# Patient Record
Sex: Female | Born: 1960 | Race: White | Hispanic: No | Marital: Single | Smoking: Current every day smoker
Health system: Southern US, Community
[De-identification: ages and names within clinical notes are randomized; demographics above are authoritative.]

## PROBLEM LIST (undated history)

## (undated) DIAGNOSIS — E079 Disorder of thyroid, unspecified: Secondary | ICD-10-CM

## (undated) HISTORY — PX: AUGMENTATION MAMMAPLASTY: SUR837

---

## 1998-01-06 ENCOUNTER — Other Ambulatory Visit: Admission: RE | Admit: 1998-01-06 | Discharge: 1998-01-06 | Payer: Self-pay | Admitting: Obstetrics and Gynecology

## 1999-03-16 ENCOUNTER — Other Ambulatory Visit: Admission: RE | Admit: 1999-03-16 | Discharge: 1999-03-16 | Payer: Self-pay | Admitting: Obstetrics and Gynecology

## 2000-03-28 ENCOUNTER — Other Ambulatory Visit: Admission: RE | Admit: 2000-03-28 | Discharge: 2000-03-28 | Payer: Self-pay | Admitting: Obstetrics and Gynecology

## 2001-05-29 ENCOUNTER — Other Ambulatory Visit: Admission: RE | Admit: 2001-05-29 | Discharge: 2001-05-29 | Payer: Self-pay | Admitting: Obstetrics and Gynecology

## 2002-06-25 ENCOUNTER — Other Ambulatory Visit: Admission: RE | Admit: 2002-06-25 | Discharge: 2002-06-25 | Payer: Self-pay | Admitting: Obstetrics and Gynecology

## 2003-12-12 ENCOUNTER — Other Ambulatory Visit: Admission: RE | Admit: 2003-12-12 | Discharge: 2003-12-12 | Payer: Self-pay | Admitting: Obstetrics and Gynecology

## 2004-09-08 ENCOUNTER — Encounter: Admission: RE | Admit: 2004-09-08 | Discharge: 2004-09-08 | Payer: Self-pay | Admitting: Neurological Surgery

## 2005-03-04 ENCOUNTER — Other Ambulatory Visit: Admission: RE | Admit: 2005-03-04 | Discharge: 2005-03-04 | Payer: Self-pay | Admitting: Obstetrics and Gynecology

## 2005-04-21 ENCOUNTER — Encounter: Payer: Self-pay | Admitting: Internal Medicine

## 2006-05-24 ENCOUNTER — Encounter: Payer: Self-pay | Admitting: Internal Medicine

## 2006-11-09 ENCOUNTER — Encounter: Payer: Self-pay | Admitting: Internal Medicine

## 2006-11-09 ENCOUNTER — Encounter: Admission: RE | Admit: 2006-11-09 | Discharge: 2006-11-09 | Payer: Self-pay | Admitting: Emergency Medicine

## 2008-03-13 ENCOUNTER — Ambulatory Visit: Payer: Self-pay | Admitting: Internal Medicine

## 2008-03-13 DIAGNOSIS — E039 Hypothyroidism, unspecified: Secondary | ICD-10-CM | POA: Insufficient documentation

## 2008-03-13 DIAGNOSIS — N926 Irregular menstruation, unspecified: Secondary | ICD-10-CM | POA: Insufficient documentation

## 2008-03-13 LAB — CONVERTED CEMR LAB
Beta hcg, urine, semiquantitative: NEGATIVE
Bilirubin Urine: NEGATIVE
Ketones, urine, test strip: NEGATIVE
Specific Gravity, Urine: 1.01
WBC Urine, dipstick: NEGATIVE
pH: 7

## 2008-03-25 ENCOUNTER — Encounter (INDEPENDENT_AMBULATORY_CARE_PROVIDER_SITE_OTHER): Payer: Self-pay | Admitting: *Deleted

## 2008-03-25 ENCOUNTER — Ambulatory Visit: Payer: Self-pay | Admitting: Internal Medicine

## 2008-03-25 LAB — CONVERTED CEMR LAB
ALT: 13 units/L (ref 0–35)
AST: 20 units/L (ref 0–37)
Eosinophils Absolute: 0.2 10*3/uL (ref 0.0–0.7)
HCT: 34.2 % — ABNORMAL LOW (ref 36.0–46.0)
Hemoglobin: 11.7 g/dL — ABNORMAL LOW (ref 12.0–15.0)
Lymphocytes Relative: 30.2 % (ref 12.0–46.0)
MCHC: 34.1 g/dL (ref 30.0–36.0)
MCV: 89.7 fL (ref 78.0–100.0)
Neutro Abs: 4.5 10*3/uL (ref 1.4–7.7)
Neutrophils Relative %: 62.4 % (ref 43.0–77.0)
OCCULT 1: NEGATIVE
Platelets: 273 10*3/uL (ref 150–400)
TSH: 2.12 microintl units/mL (ref 0.35–5.50)
Total Bilirubin: 0.5 mg/dL (ref 0.3–1.2)
Total Protein: 6.4 g/dL (ref 6.0–8.3)
WBC: 7.3 10*3/uL (ref 4.5–10.5)

## 2008-03-26 ENCOUNTER — Encounter (INDEPENDENT_AMBULATORY_CARE_PROVIDER_SITE_OTHER): Payer: Self-pay | Admitting: *Deleted

## 2008-03-28 ENCOUNTER — Encounter: Admission: RE | Admit: 2008-03-28 | Discharge: 2008-03-28 | Payer: Self-pay | Admitting: Internal Medicine

## 2008-03-29 ENCOUNTER — Encounter (INDEPENDENT_AMBULATORY_CARE_PROVIDER_SITE_OTHER): Payer: Self-pay | Admitting: *Deleted

## 2008-05-13 ENCOUNTER — Ambulatory Visit: Payer: Self-pay | Admitting: Internal Medicine

## 2008-08-08 ENCOUNTER — Ambulatory Visit: Payer: Self-pay | Admitting: Internal Medicine

## 2008-12-20 ENCOUNTER — Ambulatory Visit: Payer: Self-pay | Admitting: Internal Medicine

## 2008-12-20 DIAGNOSIS — R51 Headache: Secondary | ICD-10-CM

## 2008-12-20 DIAGNOSIS — G479 Sleep disorder, unspecified: Secondary | ICD-10-CM | POA: Insufficient documentation

## 2008-12-20 DIAGNOSIS — R519 Headache, unspecified: Secondary | ICD-10-CM | POA: Insufficient documentation

## 2009-08-21 ENCOUNTER — Ambulatory Visit: Payer: Self-pay | Admitting: Family Medicine

## 2009-09-03 ENCOUNTER — Telehealth (INDEPENDENT_AMBULATORY_CARE_PROVIDER_SITE_OTHER): Payer: Self-pay | Admitting: *Deleted

## 2010-01-09 ENCOUNTER — Telehealth (INDEPENDENT_AMBULATORY_CARE_PROVIDER_SITE_OTHER): Payer: Self-pay | Admitting: *Deleted

## 2010-03-09 ENCOUNTER — Ambulatory Visit: Payer: Self-pay | Admitting: Internal Medicine

## 2010-03-09 DIAGNOSIS — J019 Acute sinusitis, unspecified: Secondary | ICD-10-CM

## 2010-04-02 ENCOUNTER — Telehealth: Payer: Self-pay | Admitting: Internal Medicine

## 2010-04-02 ENCOUNTER — Ambulatory Visit: Payer: Self-pay | Admitting: Internal Medicine

## 2010-04-02 LAB — CONVERTED CEMR LAB: Rapid Strep: NEGATIVE

## 2010-05-28 NOTE — Progress Notes (Signed)
Summary: refill  Phone Note Refill Request Message from:  Fax from Pharmacy on Sep 03, 2009 11:11 AM  Refills Requested: Medication #1:  zolpidem tartrate 10mg  tab 1 tab at bedtime as needed fax from [facility] [address] fax # [phone]   Method Requested: Fax to Local Pharmacy Initial call taken by: Okey Regal Spring,  Sep 03, 2009 11:13 AM    Prescriptions: AMBIEN 10 MG TABS (ZOLPIDEM TARTRATE) 1 by mouth as needed  #10 x 1   Entered by:   Shonna Chock   Authorized by:   Marga Melnick MD   Signed by:   Shonna Chock on 09/03/2009   Method used:   Printed then faxed to ...       [facility] [address] (retail)       [address]       [place], [place]  [address]       Ph: [phone] or [phone]       Fax: [phone]   RxID:   [id number]

## 2010-05-28 NOTE — Assessment & Plan Note (Signed)
Summary: bad cough//lch   Vital Signs:  Patient profile:   50 year old female Height:      67 inches Weight:      136.13 pounds Temp:     97.6 degrees F oral Pulse rate:   56 / minute CC: c/o cough, head and chest congestion x 1 week   History of Present Illness: 50 yo woman here today for cough and congestion.  started 1 week ago.  no fever.  cough is productive of clear sputum.  + fatigue.  no facial pain or pressure, sore throat, ear pain.  using Mucinex w/out relief.  cough not keeping pt awake.  no known sick contacts.  Current Medications (verified): 1)  Ambien 10 Mg Tabs (Zolpidem Tartrate) .Marland Kitchen.. 1 By Mouth As Needed 2)  Alprazolam 0.5 Mg Tabs (Alprazolam) .... 1/2 By Mouth Once Daily As Needed 3)  Midrin 325-65-100 Mg Caps (Apap-Isometheptene-Dichloral) .... 2 Capsules @ The Onset of Headache Then 1 Every 5 Hour As Needed . No More Than 5 Tab Daily  Allergies (verified): No Known Drug Allergies  Review of Systems      See HPI  Physical Exam  General:  Thin,well-nourished,in no acute distress; alert,appropriate and cooperative throughout examination Head:  NCAT, no TTP over sinuses Eyes:  no injxn or inflammation Ears:  External ear exam shows no significant lesions or deformities.  Otoscopic examination reveals clear canals, tympanic membranes are intact bilaterally without bulging, retraction, inflammation or discharge. Hearing is grossly normal bilaterally. Nose:  + congestion Mouth:  + PND Lungs:  Normal respiratory effort, chest expands symmetrically. Lungs are clear to auscultation, no crackles or wheezes.  + barking cough Heart:  Normal rate and regular rhythm. S1 and S2 normal Cervical Nodes:  No lymphadenopathy noted   Impression & Recommendations:  Problem # 1:  BRONCHITIS- ACUTE (ICD-466.0) Assessment New pt's duration of sxs warrant abx.  no red flags on hx or PE- no suspicion for PNA (lack of fever, lungs clear).  Tessalon for cough.  reviewed supportive  care and red flags that should prompt return.  Pt expresses understanding and is in agreement w/ this plan. Her updated medication list for this problem includes:    Azithromycin 250 Mg Tabs (Azithromycin) .Marland Kitchen... 2 by  mouth today and then 1 daily for 4 days    Tessalon 200 Mg Caps (Benzonatate) .Marland Kitchen... 1 q8 as needed for cough  Complete Medication List: 1)  Ambien 10 Mg Tabs (Zolpidem tartrate) .Marland Kitchen.. 1 by mouth as needed 2)  Alprazolam 0.5 Mg Tabs (Alprazolam) .... 1/2 by mouth once daily as needed 3)  Midrin 325-65-100 Mg Caps (Apap-isometheptene-dichloral) .... 2 capsules @ the onset of headache then 1 every 5 hour as needed . no more than 5 tab daily 4)  Azithromycin 250 Mg Tabs (Azithromycin) .... 2 by  mouth today and then 1 daily for 4 days 5)  Tessalon 200 Mg Caps (Benzonatate) .Marland Kitchen.. 1 q8 as needed for cough  Patient Instructions: 1)  Take the Azithromycin as directed 2)  Use the cough pills as needed 3)  Continue the Mucinex 4)  Tylenol/Ibuprofen as needed 5)  If no improvement by mid week- please call 6)  Hang in there! Prescriptions: TESSALON 200 MG CAPS (BENZONATATE) 1 Q8 as needed for cough  #45 x 0   Entered and Authorized by:   Neena Rhymes MD   Signed by:   Neena Rhymes MD on 08/21/2009   Method used:   Electronically to  [id number] (retail)       [address]       [place], [place]  [address]       Ph: [phone] or [phone]       Fax: [phone]   RxID:   [id number] AZITHROMYCIN 250 MG  TABS (AZITHROMYCIN) 2 by  mouth today and then 1 daily for 4 days  #6 x 0   Entered and Authorized by:   Neena Rhymes MD   Signed by:   Neena Rhymes MD on 08/21/2009   Method used:   Electronically to        [facility] [phone]* (retail)       [address]       [place], [place]  [address]       Ph: [phone] or [phone]       Fax: [phone]   RxID:   [id number]

## 2010-05-28 NOTE — Progress Notes (Signed)
Summary: alternative  Phone Note Refill Request Message from:  Pharmacy on April 02, 2010 9:53 AM  Refills Requested: Medication #1:  CLARITHROMYCIN 500 MG XR24H-TAB 2 pills once daily with food Med above is on back order and patient is waiting at pharmacy for alternative. Please advise.  Initial call taken by: Lucious Groves CMA,  April 02, 2010 9:53 AM  Follow-up for Phone Call        Per MD they can give:   Pen V K 500mg  1 by mouth qid #40 (generic ok)  Pharmacy notified. Follow-up by: Lucious Groves CMA,  April 02, 2010 10:00 AM    New/Updated Medications: PENICILLIN V POTASSIUM 500 MG TABS (PENICILLIN V POTASSIUM) 1 by mouth qid Prescriptions: PENICILLIN V POTASSIUM 500 MG TABS (PENICILLIN V POTASSIUM) 1 by mouth qid  #40 x 0   Entered by:   Lucious Groves CMA   Authorized by:   Marga Melnick MD   Signed by:   Lucious Groves CMA on 04/02/2010   Method used:   Telephoned to ...       [facility] [address] (retail)       [address]       [place], [place]  [address]       Ph: [phone] or [phone]       Fax: [phone]   RxID:   [id number]

## 2010-05-28 NOTE — Assessment & Plan Note (Signed)
Summary: sore throat/cbs   Vital Signs:  Patient profile:   50 year old female Weight:      131.6 pounds BMI:     20.69 Temp:     98.7 degrees F oral Pulse rate:   72 / minute Resp:     14 per minute BP sitting:   110 / 68  (left arm) Cuff size:   regular  Vitals Entered By: Shonna Chock CMA (April 02, 2010 9:14 AM) CC: Sore throat since yesterday , URI symptoms   CC:  Sore throat since yesterday  and URI symptoms.  History of Present Illness:      This is a 50 year old woman who presents with URI symptoms; onset 12/7 as severe ST.  The patient reports sore throat &  R  earache, but denies nasal congestion, purulent nasal discharge, and productive cough.  The patient denies fever, dyspnea, and wheezing.  The patient denies itchy watery eyes, sneezing, and headache.  Risk factors for Strep sinusitis include tender adenopathy.  The patient denies the following risk factors for Strep sinusitis: unilateral facial pain and tooth pain.  Rx: none    Current Medications (verified): 1)  Ambien 10 Mg Tabs (Zolpidem Tartrate) .Marland Kitchen.. 1 By Mouth As Needed 2)  Alprazolam 0.5 Mg Tabs (Alprazolam) .... 1/2 By Mouth Once Daily As Needed  Allergies (verified): No Known Drug Allergies  Physical Exam  General:  Appears uncomfortable but in no acute distress; alert,appropriate and cooperative throughout examination Ears:  External ear exam shows no significant lesions or deformities.  Otoscopic examination reveals clear canals, tympanic membranes are intact bilaterally without bulging, retraction, inflammation or discharge. Hearing is grossly normal bilaterally. Nose:  External nasal examination shows no deformity or inflammation. Nasal mucosa are pink and moist without lesions or exudates. Mouth:   R tonsil hypertropied with shaggy  pharyngeal exudate.   Lungs:  Normal respiratory effort, chest expands symmetrically. Lungs are clear to auscultation, no crackles or wheezes. Heart:  Normal rate and  regular rhythm. S1 and S2 normal without gallop, murmur, click, rub or other extra sounds. Abdomen:  Bowel sounds positive,abdomen soft and non-tender without masses, organomegaly or hernias noted. Cervical Nodes:  R anterior LN tender.   Axillary Nodes:  No palpable lymphadenopathy   Impression & Recommendations:  Problem # 1:  PHARYNGITIS-ACUTE (ICD-462)  The following medications were removed from the medication list:    Amoxicillin-pot Clavulanate 875-125 Mg Tabs (Amoxicillin-pot clavulanate) .Marland Kitchen... 1 every 12 hrs with a meal Her updated medication list for this problem includes:    Clarithromycin 500 Mg Xr24h-tab (Clarithromycin) .Marland Kitchen... 2 pills once daily with food  Complete Medication List: 1)  Ambien 10 Mg Tabs (Zolpidem tartrate) .Marland Kitchen.. 1 by mouth as needed 2)  Alprazolam 0.5 Mg Tabs (Alprazolam) .... 1/2 by mouth once daily as needed 3)  Clarithromycin 500 Mg Xr24h-tab (Clarithromycin) .... 2 pills once daily with food 4)  Prednisone 20 Mg Tabs (Prednisone) .Marland Kitchen.. 1 two times a day with food  Other Orders: Rapid Strep ([address])  Patient Instructions: 1)  Zicam Melts as needed for sore throat. 2)  Please schedule a colonoscopy as we discussed based on SOC. Prescriptions: PREDNISONE 20 MG TABS (PREDNISONE) 1 two times a day with food  #10 x 0   Entered and Authorized by:   Marga Melnick MD   Signed by:   Marga Melnick MD on 04/02/2010   Method used:   F[facility]ed to ...       [facility] [phone][facility]       [address]       [place], [place]  [address]       Ph: [phone] or [phone]       Fax: [phone]   RxID:   [id number] CLARITHROMYCIN 500 MG XR24H-TAB (CLARITHROMYCIN) 2 pills once daily with food  #20 x 0   Entered and Authorized by:   Marga Melnick MD   Signed by:   Marga Melnick MD on 04/02/2010   Method used:   Faxed to ...       [id number] ([facility]       [address]       [place], [place]  [address]       Ph: [phone] or [phone]        Fax: [phone]   RxID:   [id number]    Orders Added: 1)  Rapid Strep [[address]] 2)  Est. Patient Level III [[address]]    Laboratory Results    Other Tests  Rapid Strep: negative

## 2010-05-28 NOTE — Progress Notes (Signed)
Summary: refill  Phone Note Refill Request Message from:  Fax from Pharmacy on January 09, 2010 8:53 AM  Refills Requested: Medication #1:  ALPRAZOLAM 0.5 MG TABS 1/2 by mouth once daily as needed [facility] - fax [phone]  Initial call taken by: Okey Regal Spring,  January 09, 2010 8:53 AM    New/Updated Medications: ALPRAZOLAM 0.5 MG TABS (ALPRAZOLAM) 1/2 by mouth once daily as needed **APPOINTMENT DUE** Prescriptions: ALPRAZOLAM 0.5 MG TABS (ALPRAZOLAM) 1/2 by mouth once daily as needed **APPOINTMENT DUE**  #30 x 0   Entered by:   Shonna Chock CMA   Authorized by:   Marga Melnick MD   Signed by:   Shonna Chock CMA on 01/09/2010   Method used:   Printed then faxed to ...       [facility] [address] (retail)       [address]       [place], [place]  [address]       Ph: [phone] or [phone]       Fax: [phone]   RxID:   [id number]

## 2010-05-28 NOTE — Assessment & Plan Note (Signed)
Summary: cough/cbs   Vital Signs:  Patient profile:   50 year old female Weight:      132.2 pounds BMI:     20.78 Temp:     98.2 degrees F oral Pulse rate:   72 / minute Resp:     16 per minute BP sitting:   100 / 64  (left arm) Cuff size:   regular  Vitals Entered By: Shonna Chock CMA (March 09, 2010 11:59 AM) CC: ? Sinus Infection, URI symptoms   CC:  ? Sinus Infection and URI symptoms.  History of Present Illness:  URI Symptoms      This is a 50 year old woman who presents with URI symptoms; onset 11/11 as ST & congestion.  The patient reports nasal congestion, purulent nasal discharge, sore throat, and productive cough, but denies earache.  Associated symptoms include low-grade fever (<100.5 degrees).  The patient denies dyspnea and wheezing.  The patient denies headache.  The patient denies the following risk factors for Strep sinusitis: unilateral facial pain, tooth pain, and tender adenopathy.  Rx: NSAIDS  Current Medications (verified): 1)  Ambien 10 Mg Tabs (Zolpidem Tartrate) .Marland Kitchen.. 1 By Mouth As Needed 2)  Alprazolam 0.5 Mg Tabs (Alprazolam) .... 1/2 By Mouth Once Daily As Needed **appointment Due**  Allergies (verified): No Known Drug Allergies  Physical Exam  General:  Thin,well-nourished,in no acute distress; alert,appropriate and cooperative throughout examination Eyes:  No corneal or conjunctival inflammation noted.  Ears:  External ear exam shows no significant lesions or deformities.  Otoscopic examination reveals clear canals, tympanic membranes are intact bilaterally without bulging, retraction, inflammation or discharge. Hearing is grossly normal bilaterally. Nose:  External nasal examination shows no deformity or inflammation. Nasal mucosa are pink and moist without lesions or exudates. Mouth:  Oral mucosa and oropharynx without lesions or exudates.  Teeth in good repair. Lungs:  Normal respiratory effort, chest expands symmetrically. Lungs : minimal  crackles Heart:  Normal rate and regular rhythm. S1 and S2 normal without gallop, murmur, click, rub or other extra sounds. Cervical Nodes:  Shotty LA Axillary Nodes:  No palpable lymphadenopathy   Impression & Recommendations:  Problem # 1:  SINUSITIS- ACUTE-NOS (ICD-461.9)  The following medications were removed from the medication list:    Tessalon 200 Mg Caps (Benzonatate) .Marland Kitchen... 1 q8 as needed for cough Her updated medication list for this problem includes:    Amoxicillin-pot Clavulanate 875-125 Mg Tabs (Amoxicillin-pot clavulanate) .Marland Kitchen... 1 every 12 hrs with a meal  Problem # 2:  BRONCHITIS-ACUTE (ICD-466.0)  The following medications were removed from the medication list:    Tessalon 200 Mg Caps (Benzonatate) .Marland Kitchen... 1 q8 as needed for cough Her updated medication list for this problem includes:    Amoxicillin-pot Clavulanate 875-125 Mg Tabs (Amoxicillin-pot clavulanate) .Marland Kitchen... 1 every 12 hrs with a meal  Complete Medication List: 1)  Ambien 10 Mg Tabs (Zolpidem tartrate) .Marland Kitchen.. 1 by mouth as needed 2)  Alprazolam 0.5 Mg Tabs (Alprazolam) .... 1/2 by mouth once daily as needed 3)  Amoxicillin-pot Clavulanate 875-125 Mg Tabs (Amoxicillin-pot clavulanate) .Marland Kitchen.. 1 every 12 hrs with a meal  Patient Instructions: 1)  Drink as much  NON dairy fluid as you can tolerate for the next few days. Netipot once daily until  sinuses are clear. Prescriptions: ALPRAZOLAM 0.5 MG TABS (ALPRAZOLAM) 1/2 by mouth once daily as needed  #30 x 1   Entered and Authorized by:   Marga Melnick MD   Signed by:   Chrissie Noa  Hopper MD on 03/09/2010   Method used:   Print then Give to Patient   RxID:   [id number] AMOXICILLIN-POT CLAVULANATE 875-125 MG TABS (AMOXICILLIN-POT CLAVULANATE) 1 every 12 hrs with a meal  #20 x 0   Entered and Authorized by:   Marga Melnick MD   Signed by:   Marga Melnick MD on 03/09/2010   Method used:   Faxed to ...       [id number]       [address]       [place], [place]  [address]       Ph: [phone] or [phone]       Fax: [phone]   RxID:   [id number]    Orders Added: 1)  Est. Patient Level III [[address]]

## 2010-08-28 ENCOUNTER — Other Ambulatory Visit: Payer: Self-pay

## 2010-08-28 MED ORDER — ALPRAZOLAM 0.5 MG PO TABS: 0.5000 mg | ORAL_TABLET | ORAL | Status: DC

## 2010-12-07 ENCOUNTER — Other Ambulatory Visit: Payer: Self-pay | Admitting: Internal Medicine

## 2010-12-08 MED ORDER — ALPRAZOLAM 0.5 MG PO TABS: 0.5000 mg | ORAL_TABLET | ORAL | Status: DC

## 2010-12-08 NOTE — Telephone Encounter (Signed)
rx sent to pharmacy

## 2011-03-12 ENCOUNTER — Encounter: Payer: Self-pay | Admitting: Family Medicine

## 2011-03-12 ENCOUNTER — Ambulatory Visit (INDEPENDENT_AMBULATORY_CARE_PROVIDER_SITE_OTHER): Payer: PRIVATE HEALTH INSURANCE | Admitting: Family Medicine

## 2011-03-12 DIAGNOSIS — R05 Cough: Secondary | ICD-10-CM

## 2011-03-12 DIAGNOSIS — J111 Influenza due to unidentified influenza virus with other respiratory manifestations: Secondary | ICD-10-CM

## 2011-03-12 DIAGNOSIS — J3489 Other specified disorders of nose and nasal sinuses: Secondary | ICD-10-CM

## 2011-03-12 DIAGNOSIS — R6889 Other general symptoms and signs: Secondary | ICD-10-CM

## 2011-03-12 DIAGNOSIS — J40 Bronchitis, not specified as acute or chronic: Secondary | ICD-10-CM | POA: Insufficient documentation

## 2011-03-12 MED ORDER — BENZONATATE 200 MG PO CAPS: 200.0000 mg | ORAL_CAPSULE | Freq: Three times a day (TID) | ORAL | Status: AC | PRN

## 2011-03-12 MED ORDER — GUAIFENESIN-CODEINE 100-10 MG/5ML PO SYRP: 5.0000 mL | ORAL_SOLUTION | Freq: Two times a day (BID) | ORAL | Status: DC | PRN

## 2011-03-12 MED ORDER — AZITHROMYCIN 250 MG PO TABS: 250.0000 mg | ORAL_TABLET | Freq: Every day | ORAL | Status: AC

## 2011-03-12 MED ORDER — ALPRAZOLAM 0.5 MG PO TABS: 0.5000 mg | ORAL_TABLET | ORAL | Status: DC

## 2011-03-12 NOTE — Patient Instructions (Signed)
This is most consistent w/ a bronchitis/upper respiratory infection Take the Azithromycin as directed Use the cough meds as needed Drink plenty of fluids REST! Hang in there!

## 2011-03-12 NOTE — Assessment & Plan Note (Signed)
Pt's sxs and PE consistent w/ bronchitis.  Flu test (-).  Start abx.  Reviewed supportive care and red flags that should prompt return.  Pt expressed understanding and is in agreement w/ plan.

## 2011-03-12 NOTE — Progress Notes (Signed)
  Subjective:    Patient ID: Amber Krueger, female    DOB: 08-24-1960, 50 y.o.   MRN: [id number]  HPI ? Flu- pt reports she had 'tight chest' yesterday, cough developed, Tm 101 last night.  Got the flu shot 2 weeks ago.  L sided ear pressure, + sinus pressure, + sore throat.  No known sick contacts.  Denies body aches.  + HA, dizziness.  Review of Systems For ROS see HPI     Objective:   Physical Exam  Vitals reviewed. Constitutional: She appears well-developed and well-nourished. No distress.  HENT:  Head: Normocephalic and atraumatic.       TMs normal bilaterally Mild nasal congestion Throat w/out erythema, edema, or exudate  Eyes: Conjunctivae and EOM are normal. Pupils are equal, round, and reactive to light.  Neck: Normal range of motion. Neck supple.  Cardiovascular: Normal rate, regular rhythm, normal heart sounds and intact distal pulses.   No murmur heard. Pulmonary/Chest: Effort normal and breath sounds normal. No respiratory distress. She has no wheezes.       + hacking cough  Lymphadenopathy:    She has no cervical adenopathy.          Assessment & Plan:

## 2011-09-02 ENCOUNTER — Telehealth: Payer: Self-pay | Admitting: Internal Medicine

## 2011-09-02 NOTE — Telephone Encounter (Signed)
03-12-11 Last OV, Last filled #30

## 2011-09-02 NOTE — Telephone Encounter (Signed)
Refill: Alprazolam 0.5mg  tablet. Take one tablet by mouth as directed. 1/2 by mouth daily as needed.

## 2011-09-02 NOTE — Telephone Encounter (Signed)
OK X1 

## 2011-09-03 MED ORDER — ALPRAZOLAM 0.5 MG PO TABS: 0.5000 mg | ORAL_TABLET | ORAL | Status: DC

## 2011-09-03 NOTE — Telephone Encounter (Signed)
Rx sent 

## 2011-12-03 ENCOUNTER — Telehealth: Payer: Self-pay | Admitting: Internal Medicine

## 2011-12-03 MED ORDER — ALPRAZOLAM 0.5 MG PO TABS: 0.5000 mg | ORAL_TABLET | ORAL | Status: DC

## 2011-12-03 NOTE — Telephone Encounter (Signed)
Refill: Alprazolam 0.5mg  tablet. Take one tablet by mouth as directed. 1/2 by mouth daily as needed. Last fill 09-03-11

## 2011-12-03 NOTE — Telephone Encounter (Signed)
CPX due  

## 2012-01-17 ENCOUNTER — Ambulatory Visit (INDEPENDENT_AMBULATORY_CARE_PROVIDER_SITE_OTHER): Payer: Commercial Managed Care - PPO | Admitting: Internal Medicine

## 2012-01-17 ENCOUNTER — Encounter: Payer: Self-pay | Admitting: Internal Medicine

## 2012-01-17 VITALS — BP 114/70 | HR 66 | Temp 98.4°F | Wt 129.0 lb

## 2012-01-17 DIAGNOSIS — M79609 Pain in unspecified limb: Secondary | ICD-10-CM

## 2012-01-17 DIAGNOSIS — M79632 Pain in left forearm: Secondary | ICD-10-CM

## 2012-01-17 MED ORDER — CELECOXIB 200 MG PO CAPS: 200.0000 mg | ORAL_CAPSULE | Freq: Two times a day (BID) | ORAL | Status: DC

## 2012-01-17 NOTE — Progress Notes (Signed)
  Subjective:    Patient ID: Amber Krueger, female    DOB: 1960-08-08, 51 y.o.   MRN: [id number]  HPI Symptoms began approximately one week ago as burning intermittently in the left proximal, mid forearm below the antecubital area. There was no injury or trigger for this. There's minimal localized radiation in the immediate area. She describes it is superficial; Advil once daily did not help. It is exacerbated if she is carrying weight in her palm with the arm extended period.  The symptoms seem to be stable to progressive.  There is no personal or family history of deep venous thrombosis or clotting disorder    Review of Systems Constitutional: no fever, chills, sweats, change in weight  Musculoskeletal:no  muscle cramps or pain; no  joint stiffness, redness, or swelling Skin:no rash, color change Neuro: no weakness; incontinence (stool/urine); numbness and tingling Heme:no lymphadenopathy; abnormal bruising or bleeding        Objective:   Physical Exam  She is thin but healthy in appearance; she is in no acute distress  There is no lymphadenopathy about the neck, axilla, or epitrochlear areas.  Tone and strength are normal.  Deep tendon reflexes are normal.  With opposition there is tenderness of the proximal ulnar surface of the forearm. There is a vein that's palpable and visible with the opposition maneuvers.  Range of motion is excellent; she has some crepitus at the shoulders        Assessment & Plan:  #1 form discomfort; this appears to be venous in etiology. I cannot palpate a phlebolith.  Plan: See orders and recommendations

## 2012-01-17 NOTE — Patient Instructions (Addendum)
Use an anti-inflammatory cream such as Aspercreme or Zostrix cream twice a day to the lforearm. In lieu of this warm moist compresses or  hot water bottle can be used. Do not apply ice .   If you activate My Chart; the results can be released to you as soon as they populate from the lab. If you choose not to use this program; the labs have to be reviewed, copied & mailed   causing a delay in getting the results to you.

## 2012-02-03 ENCOUNTER — Encounter: Payer: Self-pay | Admitting: Internal Medicine

## 2012-04-11 ENCOUNTER — Other Ambulatory Visit: Payer: Self-pay | Admitting: Internal Medicine

## 2012-04-11 NOTE — Telephone Encounter (Signed)
Dr Alwyn Ren has been filling med- he is PCP

## 2012-04-11 NOTE — Telephone Encounter (Signed)
Alprazolam .5mg  Qty: Last fill: 8.9.13 Take 1/2 tablet by mouth as needed as directed

## 2012-04-11 NOTE — Telephone Encounter (Signed)
Please advise on RF request.//AB/CMA 

## 2012-04-12 MED ORDER — ALPRAZOLAM 0.5 MG PO TABS: 0.5000 mg | ORAL_TABLET | ORAL | Status: DC

## 2012-04-12 NOTE — Telephone Encounter (Signed)
RX called in for # 30

## 2012-06-10 ENCOUNTER — Other Ambulatory Visit: Payer: Self-pay

## 2012-08-02 ENCOUNTER — Encounter (HOSPITAL_COMMUNITY): Payer: Self-pay | Admitting: Cardiology

## 2012-08-02 ENCOUNTER — Emergency Department (HOSPITAL_COMMUNITY)
Admission: EM | Admit: 2012-08-02 | Discharge: 2012-08-02 | Disposition: A | Discharge status: Home / Self Care | Payer: Commercial Managed Care - PPO | Attending: Emergency Medicine | Admitting: Emergency Medicine

## 2012-08-02 ENCOUNTER — Emergency Department (HOSPITAL_COMMUNITY): Payer: Commercial Managed Care - PPO

## 2012-08-02 DIAGNOSIS — S0990XA Unspecified injury of head, initial encounter: Secondary | ICD-10-CM

## 2012-08-02 DIAGNOSIS — Z79899 Other long term (current) drug therapy: Secondary | ICD-10-CM | POA: Insufficient documentation

## 2012-08-02 DIAGNOSIS — S0083XA Contusion of other part of head, initial encounter: Secondary | ICD-10-CM | POA: Insufficient documentation

## 2012-08-02 DIAGNOSIS — S0003XA Contusion of scalp, initial encounter: Secondary | ICD-10-CM | POA: Insufficient documentation

## 2012-08-02 DIAGNOSIS — F17200 Nicotine dependence, unspecified, uncomplicated: Secondary | ICD-10-CM | POA: Insufficient documentation

## 2012-08-02 DIAGNOSIS — Y92410 Unspecified street and highway as the place of occurrence of the external cause: Secondary | ICD-10-CM | POA: Insufficient documentation

## 2012-08-02 DIAGNOSIS — E079 Disorder of thyroid, unspecified: Secondary | ICD-10-CM | POA: Insufficient documentation

## 2012-08-02 DIAGNOSIS — Y9389 Activity, other specified: Secondary | ICD-10-CM | POA: Insufficient documentation

## 2012-08-02 HISTORY — DX: Disorder of thyroid, unspecified: E07.9

## 2012-08-02 MED ORDER — NAPROXEN 500 MG PO TABS: 500.0000 mg | ORAL_TABLET | Freq: Two times a day (BID) | ORAL | Status: DC

## 2012-08-02 MED ORDER — ACETAMINOPHEN 500 MG PO TABS
1000.0000 mg | ORAL_TABLET | Freq: Once | ORAL | Status: AC
  Administered 2012-08-02: 1000 mg via ORAL
  Filled 2012-08-02: qty 2

## 2012-08-02 MED ORDER — OXYCODONE-ACETAMINOPHEN 5-325 MG PO TABS: 1.0000 | ORAL_TABLET | ORAL | Status: DC | PRN
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
## 2012-08-09 ENCOUNTER — Ambulatory Visit (INDEPENDENT_AMBULATORY_CARE_PROVIDER_SITE_OTHER): Payer: Commercial Managed Care - PPO | Admitting: Internal Medicine

## 2012-08-09 ENCOUNTER — Encounter: Payer: Self-pay | Admitting: Internal Medicine

## 2012-08-09 ENCOUNTER — Other Ambulatory Visit: Payer: Self-pay | Admitting: Internal Medicine

## 2012-08-09 VITALS — BP 108/70 | HR 67 | Temp 98.2°F | Wt 133.6 lb

## 2012-08-09 DIAGNOSIS — S0083XD Contusion of other part of head, subsequent encounter: Secondary | ICD-10-CM

## 2012-08-09 DIAGNOSIS — G44309 Post-traumatic headache, unspecified, not intractable: Secondary | ICD-10-CM

## 2012-08-09 DIAGNOSIS — Z5189 Encounter for other specified aftercare: Secondary | ICD-10-CM

## 2012-08-09 DIAGNOSIS — M25541 Pain in joints of right hand: Secondary | ICD-10-CM

## 2012-08-09 DIAGNOSIS — M25549 Pain in joints of unspecified hand: Secondary | ICD-10-CM

## 2012-08-09 MED ORDER — OXYCODONE-ACETAMINOPHEN 5-325 MG PO TABS: 1.0000 | ORAL_TABLET | Freq: Four times a day (QID) | ORAL | Status: DC | PRN

## 2012-08-09 NOTE — Patient Instructions (Addendum)
Use an anti-inflammatory cream such as Aspercreme or Zostrix cream twice a day to the R knuckle as needed. In lieu of this warm moist compresses or  hot water bottle can be used. Do not apply ice .Use warm moist compresses 2 to 3 times a day to the  hematoma.
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
## 2012-12-09 ENCOUNTER — Other Ambulatory Visit: Payer: Self-pay | Admitting: Family Medicine

## 2012-12-11 NOTE — Telephone Encounter (Signed)
Last OV-Acute visits 08/09/2012 - 01/17/2012 Last refill 04/11/2012 No UDS or Contract Please advise

## 2012-12-11 NOTE — Telephone Encounter (Signed)
OK X 1 #30 

## 2013-03-01 ENCOUNTER — Other Ambulatory Visit: Payer: Self-pay

## 2013-05-08 ENCOUNTER — Other Ambulatory Visit: Payer: Self-pay | Admitting: Family Medicine

## 2013-05-21 ENCOUNTER — Encounter: Payer: Self-pay | Admitting: *Deleted

## 2013-05-21 NOTE — Telephone Encounter (Signed)
Called and left message for patient informing her that Xanax script is ready for pick up. Patient needs to sign contract and complete UDS. JG//CMA

## 2013-05-21 NOTE — Telephone Encounter (Signed)
ALPRAZolam (XANAX) 0.5 MG tablet Last refill: 12/09/12 #30 Last OV: 08/09/12 No contract on file

## 2013-05-21 NOTE — Telephone Encounter (Signed)
OK X1 

## 2014-03-18 ENCOUNTER — Telehealth: Payer: Self-pay | Admitting: *Deleted

## 2014-03-18 NOTE — Telephone Encounter (Signed)
Received medical record request via fax from [facility]. Form forwarded to [facility]. JG//CMA 

## 2014-07-10 ENCOUNTER — Encounter (HOSPITAL_COMMUNITY): Payer: Commercial Managed Care - PPO

## 2014-07-10 ENCOUNTER — Ambulatory Visit (HOSPITAL_COMMUNITY)
Admission: RE | Admit: 2014-07-10 | Discharge: 2014-07-10 | Disposition: A | Discharge status: Home / Self Care | Payer: Managed Care, Other (non HMO) | Source: Ambulatory Visit | Attending: Vascular Surgery | Admitting: Vascular Surgery

## 2014-07-10 ENCOUNTER — Other Ambulatory Visit (HOSPITAL_COMMUNITY): Payer: Self-pay | Admitting: Family Medicine

## 2014-07-10 DIAGNOSIS — I739 Peripheral vascular disease, unspecified: Secondary | ICD-10-CM | POA: Diagnosis present

## 2014-07-26 ENCOUNTER — Ambulatory Visit (HOSPITAL_COMMUNITY)
Admission: RE | Admit: 2014-07-26 | Discharge: 2014-07-26 | Disposition: A | Discharge status: Home / Self Care | Payer: Managed Care, Other (non HMO) | Source: Ambulatory Visit | Attending: Vascular Surgery | Admitting: Vascular Surgery

## 2014-07-26 ENCOUNTER — Other Ambulatory Visit (HOSPITAL_COMMUNITY): Payer: Self-pay | Admitting: Family Medicine

## 2014-07-26 DIAGNOSIS — I739 Peripheral vascular disease, unspecified: Secondary | ICD-10-CM | POA: Insufficient documentation

## 2015-05-28 ENCOUNTER — Encounter: Payer: Self-pay | Admitting: Nurse Practitioner

## 2015-05-28 ENCOUNTER — Ambulatory Visit (INDEPENDENT_AMBULATORY_CARE_PROVIDER_SITE_OTHER): Payer: Managed Care, Other (non HMO) | Admitting: Nurse Practitioner

## 2015-05-28 VITALS — BP 90/60 | HR 61 | Temp 97.8°F | Ht 68.0 in | Wt 134.5 lb

## 2015-05-28 DIAGNOSIS — J019 Acute sinusitis, unspecified: Secondary | ICD-10-CM | POA: Diagnosis not present

## 2015-05-28 MED ORDER — AMOXICILLIN-POT CLAVULANATE 875-125 MG PO TABS: 1.0000 | ORAL_TABLET | Freq: Two times a day (BID) | ORAL | Status: AC

## 2015-05-28 NOTE — Assessment & Plan Note (Signed)
New Onset Due to length of symptoms with worsening will treat empirically  Augmentin was sent to the pharmacy Encouraged Probiotics Continue OTC measures  FU prn worsening/failure to improve.    

## 2015-05-28 NOTE — Progress Notes (Signed)
Patient ID: Amber Krueger, female    DOB: [birth year]  Age: 55 y.o. MRN: [id number]  CC: Sinusitis   HPI Amber Krueger presents for CC of sinusitis x 1 month.   1) Symptoms worsening over 1 month. Feels it has progressed to her chest.  Nasal drainage- green/orange  HA Saturday felt really chilled  Frontal pain Denies fevers    Treatment to date:  Netti Pot Advil  Mucinex   Sick contacts- Denies    History Amber Krueger has a past medical history of Thyroid disease.   She has no past surgical history on file.   Her family history is not on file.She reports that she has been smoking Cigarettes.  She has a .5 pack-year smoking history. She does not have any smokeless tobacco history on file. She reports that she drinks alcohol. She reports that she does not use illicit drugs.  Outpatient Prescriptions Prior to Visit  Medication Sig Dispense Refill  . ALPRAZolam (XANAX) 0.5 MG tablet TAKE 1/2 TABLET BY MOUTH AS NEEDED ONLY 30 tablet 0  . estradiol (VIVELLE-DOT) 0.05 MG/24HR Place 1 patch onto the skin every 14 (fourteen) days. EVERY 2 WEEKS SINCE November 2012    . levothyroxine (SYNTHROID, LEVOTHROID) 50 MCG tablet Take 50 mcg by mouth daily.    . progesterone (PROMETRIUM) 100 MG capsule Take 100 mg by mouth daily.    . naproxen (NAPROSYN) 500 MG tablet Take 1 tablet (500 mg total) by mouth 2 (two) times daily with a meal. 30 tablet 0  . oxyCODONE-acetaminophen (PERCOCET) 5-325 MG per tablet Take 1 tablet by mouth every 6 (six) hours as needed for pain. 30 tablet 0   No facility-administered medications prior to visit.    ROS Review of Systems  Constitutional: Negative for fever, chills, diaphoresis and fatigue.  HENT: Positive for congestion, postnasal drip, rhinorrhea, sneezing and sore throat. Negative for trouble swallowing and voice change.   Respiratory: Negative for chest tightness, shortness of breath and wheezing.   Cardiovascular: Negative for chest pain, palpitations and  leg swelling.  Gastrointestinal: Negative for nausea, vomiting and diarrhea.  Skin: Negative for rash.  Neurological: Positive for headaches. Negative for dizziness.    Objective:  BP 90/60 mmHg  Pulse 61  Temp(Src) 97.8 F (36.6 C) (Oral)  Ht  (1.727 m)  Wt 134 lb 8 oz (61.009 kg)  BMI 20.46 kg/m2  SpO2 98%  Physical Exam  Constitutional: She is oriented to person, place, and time. She appears well-developed and well-nourished. No distress.  HENT:  Head: Normocephalic and atraumatic.  Right Ear: External ear normal.  Left Ear: External ear normal.  Mouth/Throat: Oropharynx is clear and moist. No oropharyngeal exudate.  Maxillary and frontal tenderness TM's injected and bulging bilaterally   Eyes: Conjunctivae and EOM are normal. Pupils are equal, round, and reactive to light. Right eye exhibits no discharge. Left eye exhibits no discharge. No scleral icterus.  Neck: Normal range of motion. Neck supple.  Cardiovascular: Normal rate, regular rhythm and normal heart sounds.   Pulmonary/Chest: Effort normal and breath sounds normal. No respiratory distress. She has no wheezes. She has no rales. She exhibits no tenderness.  Lymphadenopathy:    She has no cervical adenopathy.  Neurological: She is alert and oriented to person, place, and time. No cranial nerve deficit. She exhibits normal muscle tone. Coordination normal.  Skin: Skin is warm and dry. No rash noted. She is not diaphoretic.  Psychiatric: She has a normal mood and affect. Her  behavior is normal. Judgment and thought content normal.   Assessment & Plan:   Amber Krueger was seen today for sinusitis.  Diagnoses and all orders for this visit:  SINUSITIS- ACUTE-NOS  Other orders -     amoxicillin-clavulanate (AUGMENTIN) 875-125 MG tablet; Take 1 tablet by mouth 2 (two) times daily.  I have discontinued Amber Krueger's naproxen and oxyCODONE-acetaminophen. I am also having her start on amoxicillin-clavulanate.  Additionally, I am having her maintain her levothyroxine, estradiol, progesterone, ALPRAZolam, and valACYclovir.  Meds ordered this encounter  Medications  . valACYclovir (VALTREX) 1000 MG tablet    Sig: Take 1,000 mg by mouth 2 (two) times daily.    Refill:  0  . amoxicillin-clavulanate (AUGMENTIN) 875-125 MG tablet    Sig: Take 1 tablet by mouth 2 (two) times daily.    Dispense:  14 tablet    Refill:  0    Order Specific Question:  Supervising Provider    Answer:  Sherlene Shams [[id number]]     Follow-up: Return if symptoms worsen or fail to improve.

## 2015-05-28 NOTE — Patient Instructions (Signed)
Augmentin as directed.   Please take a probiotic ( Align, Floraque or Culturelle) while you are on the antibiotic to prevent a serious antibiotic associated diarrhea  Called clostirudium dificile colitis and a vaginal yeast infection.

## 2015-05-28 NOTE — Progress Notes (Signed)
Pre visit review using our clinic review tool, if applicable. No additional management support is needed unless otherwise documented below in the visit note. 

## 2015-11-29 ENCOUNTER — Other Ambulatory Visit: Payer: Self-pay | Admitting: Sports Medicine

## 2015-11-29 DIAGNOSIS — G8929 Other chronic pain: Secondary | ICD-10-CM

## 2015-11-29 DIAGNOSIS — M25511 Pain in right shoulder: Principal | ICD-10-CM

## 2017-01-25 ENCOUNTER — Other Ambulatory Visit: Payer: Self-pay | Admitting: Obstetrics and Gynecology

## 2017-01-25 DIAGNOSIS — R928 Other abnormal and inconclusive findings on diagnostic imaging of breast: Secondary | ICD-10-CM

## 2017-01-28 ENCOUNTER — Other Ambulatory Visit: Payer: Self-pay | Admitting: Obstetrics and Gynecology

## 2017-01-28 ENCOUNTER — Ambulatory Visit
Admission: RE | Admit: 2017-01-28 | Discharge: 2017-01-28 | Disposition: A | Discharge status: Home / Self Care | Payer: Managed Care, Other (non HMO) | Source: Ambulatory Visit | Attending: Obstetrics and Gynecology | Admitting: Obstetrics and Gynecology

## 2017-01-28 DIAGNOSIS — R928 Other abnormal and inconclusive findings on diagnostic imaging of breast: Secondary | ICD-10-CM

## 2017-01-28 DIAGNOSIS — N63 Unspecified lump in unspecified breast: Secondary | ICD-10-CM

## 2017-08-02 ENCOUNTER — Other Ambulatory Visit: Payer: Self-pay | Admitting: Obstetrics and Gynecology

## 2017-08-02 ENCOUNTER — Ambulatory Visit
Admission: RE | Admit: 2017-08-02 | Discharge: 2017-08-02 | Disposition: A | Discharge status: Home / Self Care | Payer: Managed Care, Other (non HMO) | Source: Ambulatory Visit | Attending: Obstetrics and Gynecology | Admitting: Obstetrics and Gynecology

## 2017-08-02 DIAGNOSIS — N63 Unspecified lump in unspecified breast: Secondary | ICD-10-CM

## 2018-01-25 DIAGNOSIS — Z6821 Body mass index (BMI) 21.0-21.9, adult: Secondary | ICD-10-CM | POA: Diagnosis not present

## 2018-01-25 DIAGNOSIS — Z13820 Encounter for screening for osteoporosis: Secondary | ICD-10-CM | POA: Diagnosis not present

## 2018-01-25 DIAGNOSIS — Z01419 Encounter for gynecological examination (general) (routine) without abnormal findings: Secondary | ICD-10-CM | POA: Diagnosis not present

## 2018-02-03 DIAGNOSIS — M7701 Medial epicondylitis, right elbow: Secondary | ICD-10-CM | POA: Diagnosis not present

## 2018-02-03 DIAGNOSIS — M25521 Pain in right elbow: Secondary | ICD-10-CM | POA: Diagnosis not present

## 2018-02-07 ENCOUNTER — Other Ambulatory Visit: Payer: Managed Care, Other (non HMO)

## 2018-02-08 ENCOUNTER — Other Ambulatory Visit: Payer: Self-pay | Admitting: Obstetrics and Gynecology

## 2018-02-08 ENCOUNTER — Ambulatory Visit
Admission: RE | Admit: 2018-02-08 | Discharge: 2018-02-08 | Disposition: A | Discharge status: Home / Self Care | Payer: BLUE CROSS/BLUE SHIELD | Source: Ambulatory Visit | Attending: Obstetrics and Gynecology | Admitting: Obstetrics and Gynecology

## 2018-02-08 DIAGNOSIS — N6489 Other specified disorders of breast: Secondary | ICD-10-CM | POA: Diagnosis not present

## 2018-02-08 DIAGNOSIS — N63 Unspecified lump in unspecified breast: Secondary | ICD-10-CM

## 2018-02-08 DIAGNOSIS — R922 Inconclusive mammogram: Secondary | ICD-10-CM | POA: Diagnosis not present

## 2018-02-23 DIAGNOSIS — J209 Acute bronchitis, unspecified: Secondary | ICD-10-CM | POA: Diagnosis not present

## 2018-04-15 DIAGNOSIS — M25521 Pain in right elbow: Secondary | ICD-10-CM | POA: Diagnosis not present

## 2018-04-15 DIAGNOSIS — M7701 Medial epicondylitis, right elbow: Secondary | ICD-10-CM | POA: Diagnosis not present

## 2019-02-28 ENCOUNTER — Other Ambulatory Visit: Payer: Self-pay | Admitting: Obstetrics and Gynecology

## 2019-02-28 DIAGNOSIS — N63 Unspecified lump in unspecified breast: Secondary | ICD-10-CM

## 2019-03-01 ENCOUNTER — Other Ambulatory Visit: Payer: Self-pay

## 2019-03-01 ENCOUNTER — Ambulatory Visit
Admission: RE | Admit: 2019-03-01 | Discharge: 2019-03-01 | Disposition: A | Discharge status: Home / Self Care | Payer: BLUE CROSS/BLUE SHIELD | Source: Ambulatory Visit | Attending: Obstetrics and Gynecology | Admitting: Obstetrics and Gynecology

## 2019-03-01 ENCOUNTER — Other Ambulatory Visit: Payer: Self-pay | Admitting: Obstetrics and Gynecology

## 2019-03-01 ENCOUNTER — Ambulatory Visit
Admission: RE | Admit: 2019-03-01 | Discharge: 2019-03-01 | Disposition: A | Discharge status: Home / Self Care | Payer: 59 | Source: Ambulatory Visit | Attending: Obstetrics and Gynecology | Admitting: Obstetrics and Gynecology

## 2019-03-01 DIAGNOSIS — N63 Unspecified lump in unspecified breast: Secondary | ICD-10-CM

## 2020-01-17 IMAGING — MG DIGITAL DIAGNOSTIC BILATERAL MAMMOGRAM WITH IMPLANTS, CAD AND TO
8 of 12 series · 8 of 28 positions shown · non-contrast
Comparison: Previous exam(s).

CLINICAL DATA: Follow-up of probably benign left breast 5:30
o'clock and 4 o'clock nodules. History of bilateral breast implant
augmentation.

EXAM:
DIGITAL DIAGNOSTIC BILATERAL MAMMOGRAM WITH IMPLANTS, CAD AND TOMO
ULTRASOUND LEFT BREAST
The patient has retropectoral implants. Standard and implant
displaced views were performed.

[L CC]
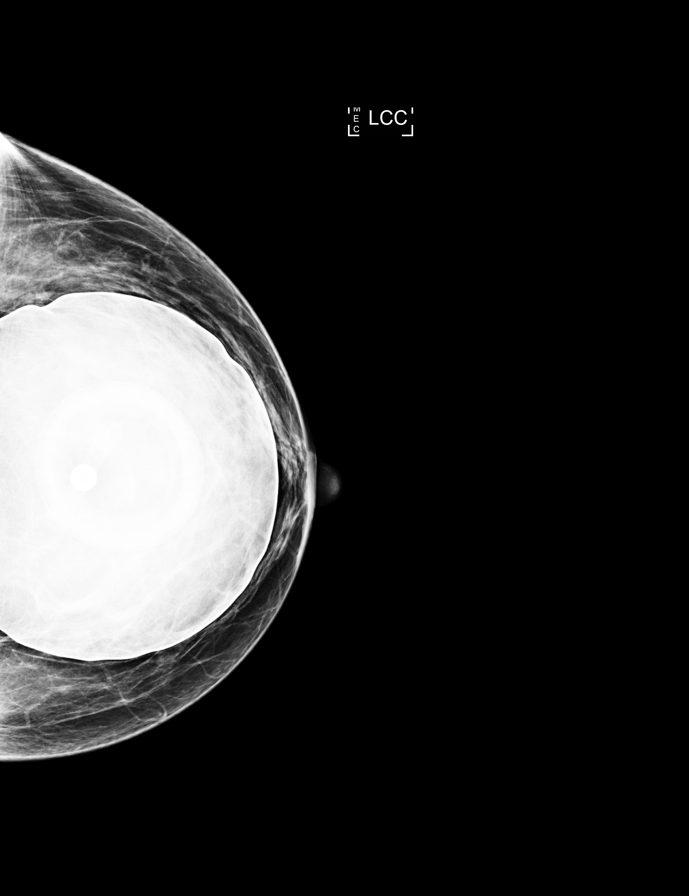

[L MLO]
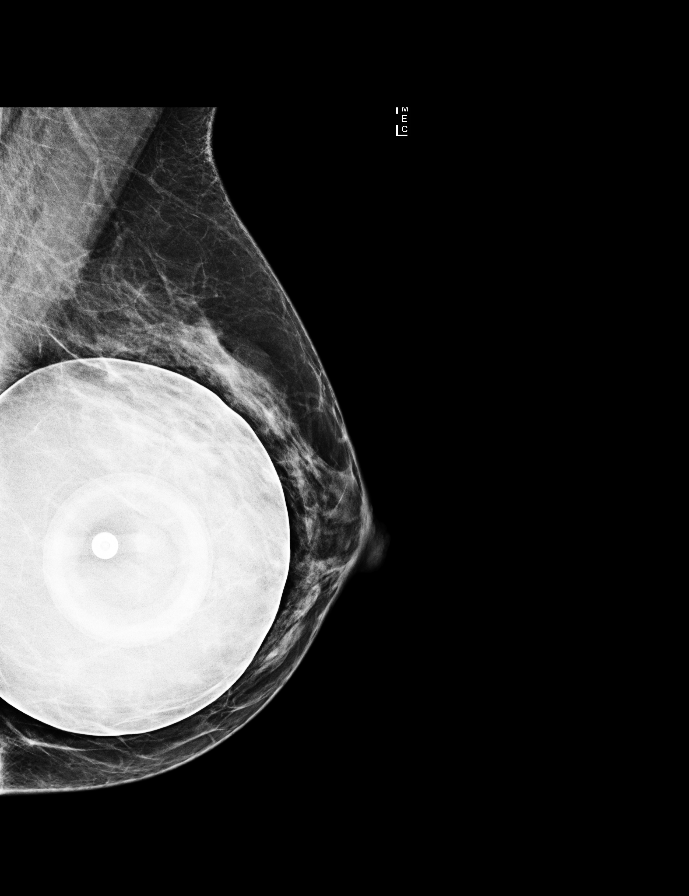

[R CC]
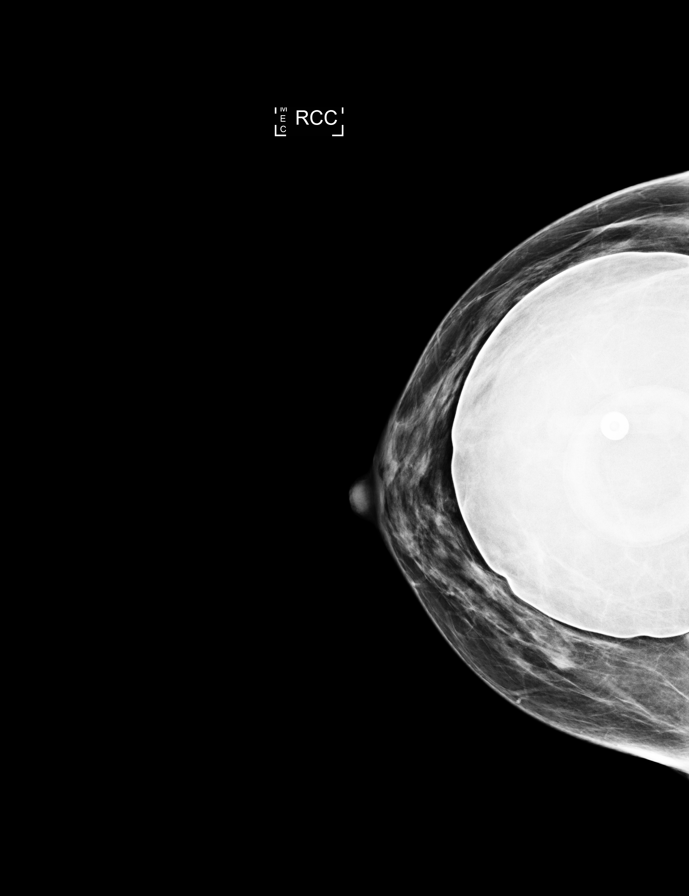

[R MLO]
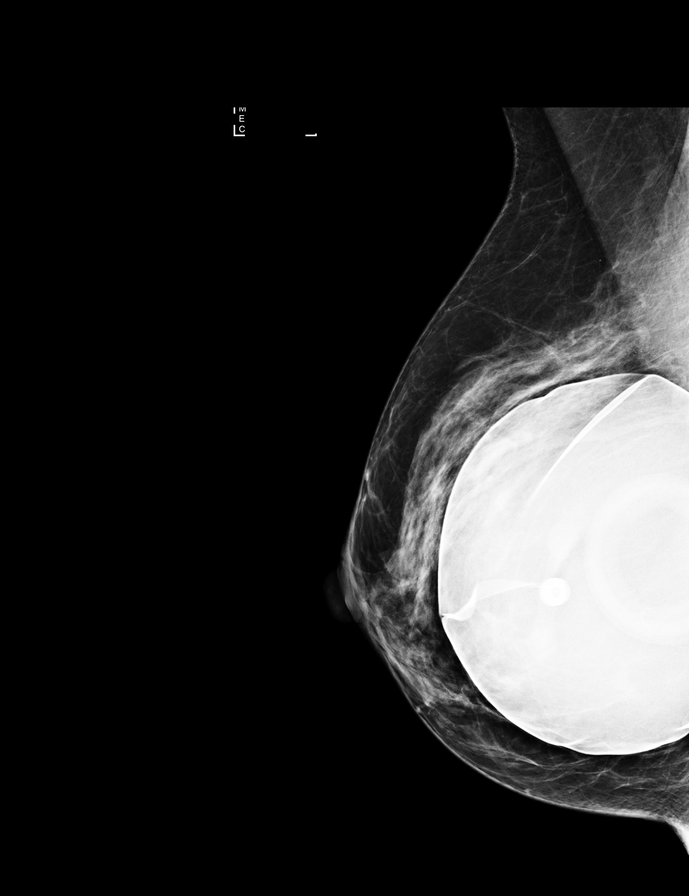

[L CC synth-2D]
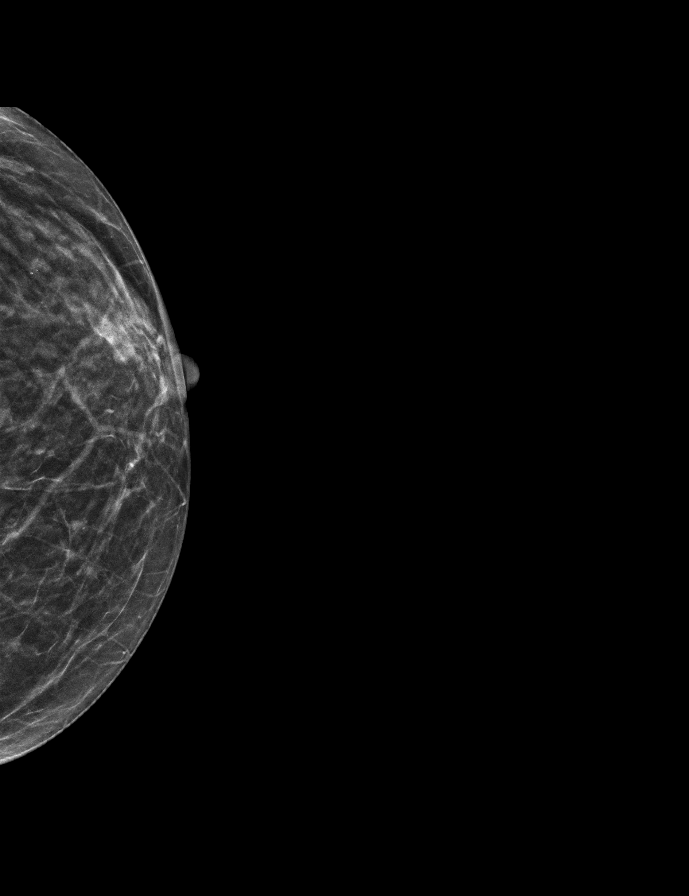

[L MLO synth-2D]
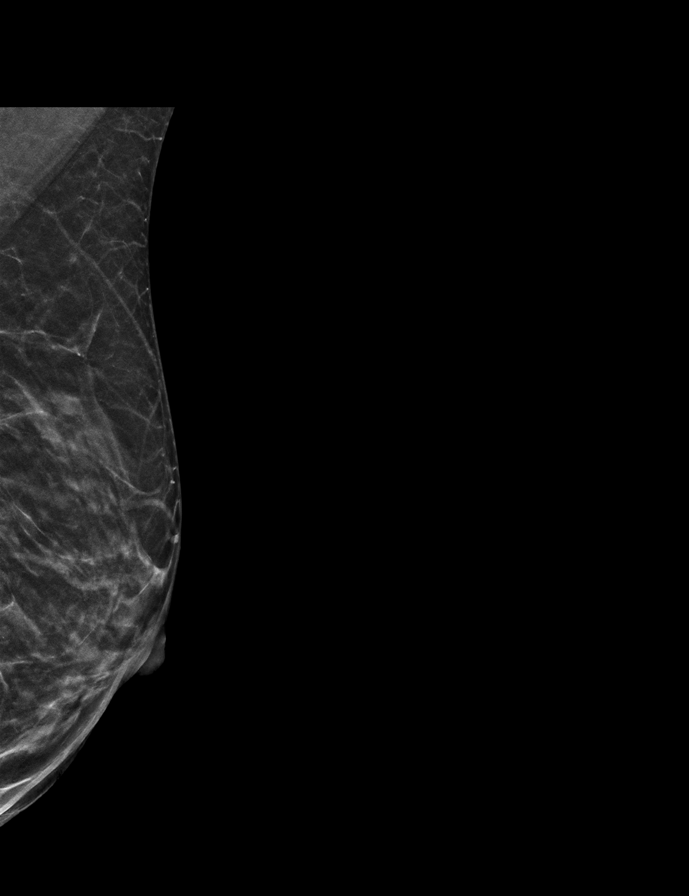

[R CC synth-2D]
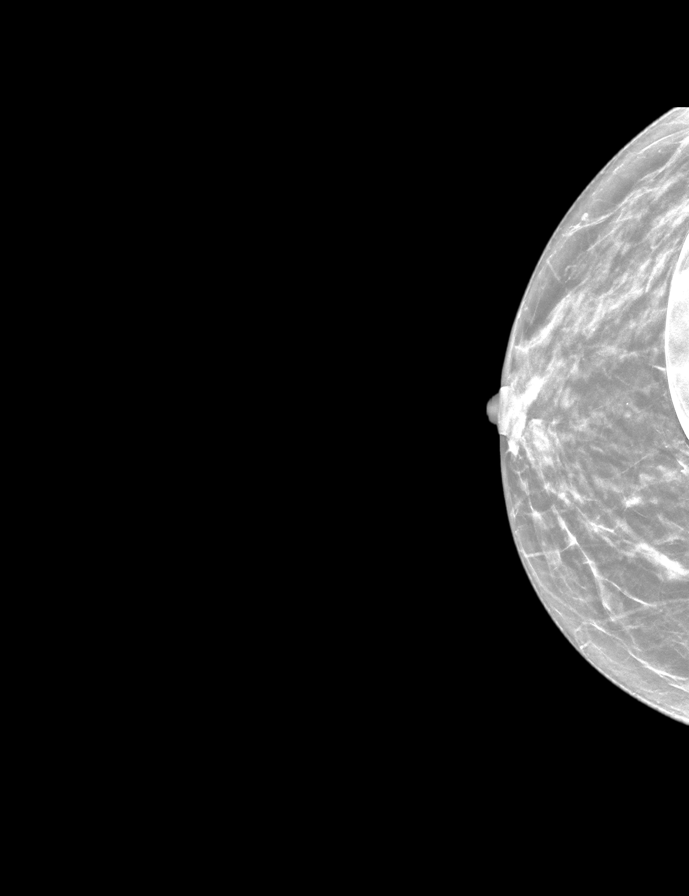

[R MLO synth-2D]
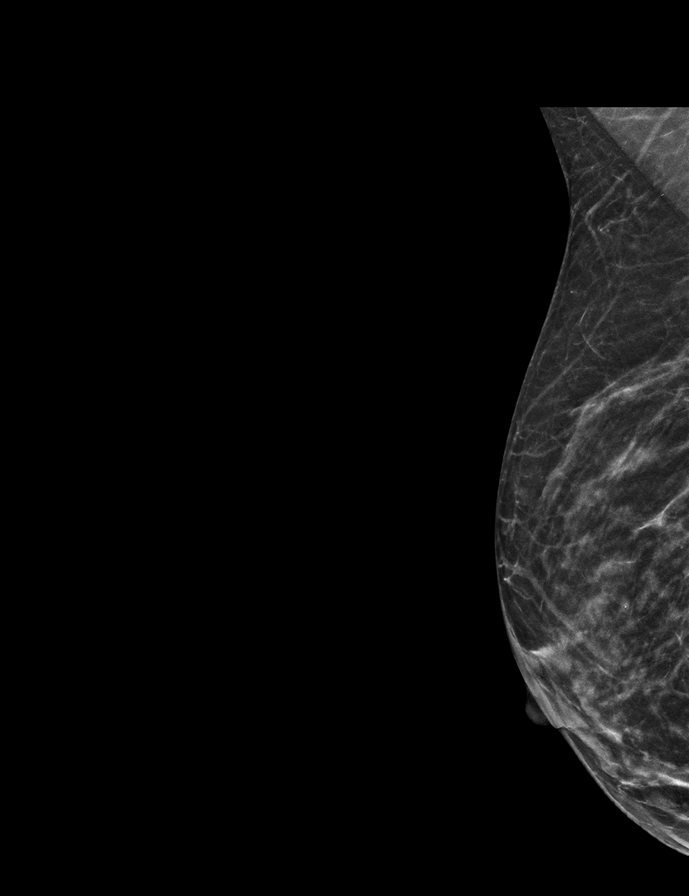

[8 of 28 positions shown; findings below may reference images not displayed]

ACR Breast Density Category c: The breast tissue is heterogeneously
dense, which may obscure small masses.
FINDINGS: Mammographically, there are no suspicious masses, areas of
architectural distortion or microcalcifications in either breast.
The previously demonstrated few mm nodule in the lateral left breast
with possible associated 2 calcifications has decreased in size and
prominence mammographically.

On physical exam, no suspicious masses are palpated.

Targeted ultrasound is performed, showing left breast 5:30 o'clock 5
cm from the nipple stable hypoechoic circumscribed horizontally
oriented nodule which measures 0.2 x 0.2 x 0.6 cm. The previously
demonstrated by ultrasound hypoechoic nodule labeled 4 o'clock 5 cm
from the nipple, on focused ultrasound dated January 28, 2017, is not
seen on today's exam.

Mammographic images were processed with CAD.
IMPRESSION: Stable to less prominent probably benign left lateral breast few mm
nodule.

RECOMMENDATION:
Bilateral diagnostic mammogram and focused left breast ultrasound,
at the discretion of the performing radiologist, is recommended in
12 months.

I have discussed the findings and recommendations with the patient.
Results were also provided in writing at the conclusion of the
visit. If applicable, a reminder letter will be sent to the patient
regarding the next appointment.

BI-RADS CATEGORY  3: Probably benign.

## 2020-04-07 ENCOUNTER — Other Ambulatory Visit: Payer: Self-pay | Admitting: Obstetrics and Gynecology

## 2020-04-07 DIAGNOSIS — R928 Other abnormal and inconclusive findings on diagnostic imaging of breast: Secondary | ICD-10-CM

## 2020-04-11 ENCOUNTER — Other Ambulatory Visit: Payer: 59

## 2020-05-15 ENCOUNTER — Other Ambulatory Visit: Payer: Self-pay

## 2020-05-15 ENCOUNTER — Other Ambulatory Visit: Payer: Self-pay | Admitting: Obstetrics and Gynecology

## 2020-05-15 ENCOUNTER — Ambulatory Visit
Admission: RE | Admit: 2020-05-15 | Discharge: 2020-05-15 | Disposition: A | Discharge status: Home / Self Care | Payer: 59 | Source: Ambulatory Visit | Attending: Obstetrics and Gynecology | Admitting: Obstetrics and Gynecology

## 2020-05-15 DIAGNOSIS — R928 Other abnormal and inconclusive findings on diagnostic imaging of breast: Secondary | ICD-10-CM

## 2020-11-19 ENCOUNTER — Other Ambulatory Visit: Payer: Self-pay | Admitting: Obstetrics and Gynecology

## 2020-11-19 ENCOUNTER — Ambulatory Visit
Admission: RE | Admit: 2020-11-19 | Discharge: 2020-11-19 | Disposition: A | Discharge status: Home / Self Care | Payer: 59 | Source: Ambulatory Visit | Attending: Obstetrics and Gynecology | Admitting: Obstetrics and Gynecology

## 2020-11-19 ENCOUNTER — Other Ambulatory Visit: Payer: Self-pay

## 2020-11-19 ENCOUNTER — Ambulatory Visit: Admission: RE | Admit: 2020-11-19 | Payer: 59 | Source: Ambulatory Visit

## 2020-11-19 DIAGNOSIS — R928 Other abnormal and inconclusive findings on diagnostic imaging of breast: Secondary | ICD-10-CM

## 2021-04-02 ENCOUNTER — Other Ambulatory Visit: Payer: Self-pay | Admitting: Obstetrics and Gynecology

## 2021-04-02 DIAGNOSIS — R928 Other abnormal and inconclusive findings on diagnostic imaging of breast: Secondary | ICD-10-CM

## 2021-05-25 ENCOUNTER — Ambulatory Visit: Payer: 59

## 2021-05-25 ENCOUNTER — Other Ambulatory Visit: Payer: 59

## 2021-05-25 ENCOUNTER — Ambulatory Visit
Admission: RE | Admit: 2021-05-25 | Discharge: 2021-05-25 | Disposition: A | Discharge status: Home / Self Care | Payer: 59 | Source: Ambulatory Visit | Attending: Obstetrics and Gynecology | Admitting: Obstetrics and Gynecology

## 2021-05-25 ENCOUNTER — Other Ambulatory Visit: Payer: Self-pay | Admitting: Obstetrics and Gynecology

## 2021-05-25 DIAGNOSIS — R928 Other abnormal and inconclusive findings on diagnostic imaging of breast: Secondary | ICD-10-CM

## 2022-10-14 ENCOUNTER — Other Ambulatory Visit: Payer: Self-pay | Admitting: Obstetrics and Gynecology

## 2022-10-14 DIAGNOSIS — Z1231 Encounter for screening mammogram for malignant neoplasm of breast: Secondary | ICD-10-CM

## 2022-11-10 ENCOUNTER — Ambulatory Visit: Payer: 59

## 2022-11-10 ENCOUNTER — Ambulatory Visit
Admission: RE | Admit: 2022-11-10 | Discharge: 2022-11-10 | Disposition: A | Discharge status: Home / Self Care | Payer: 59 | Source: Ambulatory Visit | Attending: Obstetrics and Gynecology | Admitting: Obstetrics and Gynecology

## 2022-11-10 DIAGNOSIS — Z1231 Encounter for screening mammogram for malignant neoplasm of breast: Secondary | ICD-10-CM

## 2023-02-02 ENCOUNTER — Ambulatory Visit (HOSPITAL_BASED_OUTPATIENT_CLINIC_OR_DEPARTMENT_OTHER)
Admission: RE | Admit: 2023-02-02 | Discharge: 2023-02-02 | Disposition: A | Discharge status: Home / Self Care | Payer: 59 | Source: Ambulatory Visit | Attending: *Deleted | Admitting: *Deleted

## 2023-02-02 ENCOUNTER — Other Ambulatory Visit (HOSPITAL_BASED_OUTPATIENT_CLINIC_OR_DEPARTMENT_OTHER): Payer: Self-pay | Admitting: *Deleted

## 2023-02-02 DIAGNOSIS — S0990XA Unspecified injury of head, initial encounter: Secondary | ICD-10-CM | POA: Insufficient documentation

## 2023-08-15 ENCOUNTER — Other Ambulatory Visit (HOSPITAL_COMMUNITY): Payer: Self-pay

## 2023-08-17 ENCOUNTER — Other Ambulatory Visit (HOSPITAL_COMMUNITY): Payer: Self-pay

## 2023-09-12 ENCOUNTER — Other Ambulatory Visit: Payer: Self-pay | Admitting: Obstetrics and Gynecology

## 2023-09-12 DIAGNOSIS — Z1231 Encounter for screening mammogram for malignant neoplasm of breast: Secondary | ICD-10-CM

## 2023-11-14 ENCOUNTER — Ambulatory Visit
Admission: RE | Admit: 2023-11-14 | Discharge: 2023-11-14 | Disposition: A | Discharge status: Home / Self Care | Source: Ambulatory Visit | Attending: Obstetrics and Gynecology | Admitting: Obstetrics and Gynecology

## 2023-11-14 DIAGNOSIS — Z1231 Encounter for screening mammogram for malignant neoplasm of breast: Secondary | ICD-10-CM

## 2023-11-17 ENCOUNTER — Other Ambulatory Visit: Payer: Self-pay | Admitting: Obstetrics and Gynecology

## 2023-11-17 DIAGNOSIS — R928 Other abnormal and inconclusive findings on diagnostic imaging of breast: Secondary | ICD-10-CM

## 2023-11-22 ENCOUNTER — Ambulatory Visit
Admission: RE | Admit: 2023-11-22 | Discharge: 2023-11-22 | Disposition: A | Discharge status: Home / Self Care | Source: Ambulatory Visit | Attending: Obstetrics and Gynecology | Admitting: Obstetrics and Gynecology

## 2023-11-22 DIAGNOSIS — R928 Other abnormal and inconclusive findings on diagnostic imaging of breast: Secondary | ICD-10-CM

## 2023-11-23 ENCOUNTER — Other Ambulatory Visit: Payer: Self-pay | Admitting: Obstetrics and Gynecology

## 2023-11-23 DIAGNOSIS — R921 Mammographic calcification found on diagnostic imaging of breast: Secondary | ICD-10-CM

## 2023-12-26 ENCOUNTER — Emergency Department (HOSPITAL_BASED_OUTPATIENT_CLINIC_OR_DEPARTMENT_OTHER)
Admission: EM | Admit: 2023-12-26 | Discharge: 2023-12-26 | Disposition: A | Discharge status: Against Medical Advice | Attending: Emergency Medicine | Admitting: Emergency Medicine

## 2023-12-26 ENCOUNTER — Other Ambulatory Visit: Payer: Self-pay

## 2023-12-26 DIAGNOSIS — E039 Hypothyroidism, unspecified: Secondary | ICD-10-CM | POA: Insufficient documentation

## 2023-12-26 DIAGNOSIS — Y900 Blood alcohol level of less than 20 mg/100 ml: Secondary | ICD-10-CM | POA: Diagnosis not present

## 2023-12-26 DIAGNOSIS — Z5329 Procedure and treatment not carried out because of patient's decision for other reasons: Secondary | ICD-10-CM | POA: Diagnosis not present

## 2023-12-26 DIAGNOSIS — I471 Supraventricular tachycardia, unspecified: Secondary | ICD-10-CM | POA: Insufficient documentation

## 2023-12-26 DIAGNOSIS — Z79899 Other long term (current) drug therapy: Secondary | ICD-10-CM | POA: Diagnosis not present

## 2023-12-26 DIAGNOSIS — R7989 Other specified abnormal findings of blood chemistry: Secondary | ICD-10-CM

## 2023-12-26 DIAGNOSIS — R0602 Shortness of breath: Secondary | ICD-10-CM | POA: Diagnosis present

## 2023-12-26 LAB — COMPREHENSIVE METABOLIC PANEL WITH GFR
ALT: 30 U/L (ref 0–44)
AST: 58 U/L — ABNORMAL HIGH (ref 15–41)
Albumin: 3.8 g/dL (ref 3.5–5.0)
Alkaline Phosphatase: 41 U/L (ref 38–126)
Anion gap: 13 (ref 5–15)
BUN: 15 mg/dL (ref 8–23)
CO2: 19 mmol/L — ABNORMAL LOW (ref 22–32)
Calcium: 8.1 mg/dL — ABNORMAL LOW (ref 8.9–10.3)
Chloride: 108 mmol/L (ref 98–111)
Creatinine, Ser: 0.9 mg/dL (ref 0.44–1.00)
GFR, Estimated: >60 mL/min (ref >60)
Glucose, Bld: 77 mg/dL (ref 70–99)
Potassium: 3.4 mmol/L — ABNORMAL LOW (ref 3.5–5.1)
Sodium: 140 mmol/L (ref 135–145)
Total Bilirubin: 0.3 mg/dL (ref 0.0–1.2)
Total Protein: 5.6 g/dL — ABNORMAL LOW (ref 6.5–8.1)

## 2023-12-26 LAB — T4, FREE: Free T4: 0.89 ng/dL (ref 0.61–1.12)

## 2023-12-26 LAB — CBC WITH DIFFERENTIAL/PLATELET
Abs Immature Granulocytes: 0.01 K/uL (ref 0.00–0.07)
Basophils Absolute: 0 K/uL (ref 0.0–0.1)
Basophils Relative: 1 %
Eosinophils Absolute: 0.2 K/uL (ref 0.0–0.5)
Eosinophils Relative: 3 %
HCT: 34.9 % — ABNORMAL LOW (ref 36.0–46.0)
Hemoglobin: 11.9 g/dL — ABNORMAL LOW (ref 12.0–15.0)
Immature Granulocytes: 0 %
Lymphocytes Relative: 30 %
Lymphs Abs: 2.1 K/uL (ref 0.7–4.0)
MCH: 30.2 pg (ref 26.0–34.0)
MCHC: 34.1 g/dL (ref 30.0–36.0)
MCV: 88.6 fL (ref 80.0–100.0)
Monocytes Absolute: 0.4 K/uL (ref 0.1–1.0)
Monocytes Relative: 5 %
Neutro Abs: 4.2 K/uL (ref 1.7–7.7)
Neutrophils Relative %: 61 %
Platelets: 209 K/uL (ref 150–400)
RBC: 3.94 MIL/uL (ref 3.87–5.11)
RDW: 12.8 % (ref 11.5–15.5)
WBC: 6.8 K/uL (ref 4.0–10.5)
nRBC: 0 % (ref 0.0–0.2)

## 2023-12-26 LAB — TROPONIN T, HIGH SENSITIVITY
Troponin T High Sensitivity: 22 ng/L — ABNORMAL HIGH (ref 0–19)
Troponin T High Sensitivity: 115 ng/L (ref 0–19)

## 2023-12-26 LAB — PRO BRAIN NATRIURETIC PEPTIDE: Pro Brain Natriuretic Peptide: 293 pg/mL (ref <300.0)

## 2023-12-26 LAB — D-DIMER, QUANTITATIVE: D-Dimer, Quant: <0.27 ug{FEU}/mL (ref 0.00–0.50)

## 2023-12-26 LAB — MAGNESIUM: Magnesium: 1.8 mg/dL (ref 1.7–2.4)

## 2023-12-26 LAB — ETHANOL: Alcohol, Ethyl (B): <15 mg/dL (ref <15)

## 2023-12-26 LAB — TSH: TSH: 2.07 u[IU]/mL (ref 0.350–4.500)

## 2023-12-26 MED ORDER — POTASSIUM CHLORIDE CRYS ER 20 MEQ PO TBCR
40.0000 meq | EXTENDED_RELEASE_TABLET | Freq: Once | ORAL | Status: AC
  Administered 2023-12-26: 40 meq via ORAL
  Filled 2023-12-26: qty 2

## 2023-12-26 MED ORDER — ASPIRIN 81 MG PO CHEW
324.0000 mg | CHEWABLE_TABLET | Freq: Once | ORAL | Status: AC
  Administered 2023-12-26: 324 mg via ORAL
  Filled 2023-12-26: qty 4

## 2023-12-26 MED ORDER — SODIUM CHLORIDE 0.9 % IV BOLUS
1000.0000 mL | Freq: Once | INTRAVENOUS | Status: AC
  Administered 2023-12-26: 1000 mL via INTRAVENOUS

## 2023-12-26 MED ORDER — ADENOSINE 6 MG/2ML IV SOLN
INTRAVENOUS | Status: AC
  Filled 2023-12-26: qty 6

## 2023-12-26 MED ORDER — POTASSIUM CHLORIDE CRYS ER 20 MEQ PO TBCR: 20.0000 meq | EXTENDED_RELEASE_TABLET | Freq: Every day | ORAL | 0 refills | Status: AC

## 2023-12-26 NOTE — ED Notes (Addendum)
 Patient brought back to emergency room and placed on Zoll cardiac monitor. Patient spontaneously converted back to sinus rhythm. Dr. Jerrol at bedside. Obtained EKG at this time. Patient is alert and oriented x4.

## 2023-12-26 NOTE — ED Triage Notes (Signed)
 Pt c/o tachycardia, SHOB, dizziness onset approx 1hr ago.

## 2023-12-26 NOTE — ED Provider Notes (Signed)
 [place] EMERGENCY DEPARTMENT AT [facility] Provider Note   CSN: [id number] Arrival date & time: 12/26/23  1430     Patient presents with: Tachycardia   Amber Krueger is a 63 y.o. female.   HPI   63 year old female with medical history significant for hypothyroidism on Synthroid who presents to the emergency department with a chief complaint of heart palpitations.  The patient states that her symptoms came on suddenly around 1 PM today.  She states that yesterday all day she was consuming alcohol.  She is not a regular consumer of alcohol daily.  She states that she was drinking and out in the sun for the weekend and feels dehydrated.  Today she had sudden onset heart palpitations with associated shortness of breath.  She denies any lower extremity swelling.  She denies any chest pain.  No cough.  Prior to Admission medications   Medication Sig Start Date End Date Taking? Authorizing Provider  ALPRAZolam  (XANAX ) 0.5 MG tablet TAKE 1/2 TABLET BY MOUTH AS NEEDED ONLY 05/08/13   Tish Elsie FALCON, MD  amoxicillin -clavulanate (AUGMENTIN ) 875-125 MG tablet Take 1 tablet by mouth 2 (two) times daily. 05/28/15   Valera Elenor HERO, RN  estradiol (VIVELLE-DOT) 0.05 MG/24HR Place 1 patch onto the skin every 14 (fourteen) days. EVERY 2 WEEKS SINCE November 2012    [provider]  levothyroxine (SYNTHROID, LEVOTHROID) 50 MCG tablet Take 50 mcg by mouth daily.    [provider]  progesterone (PROMETRIUM) 100 MG capsule Take 100 mg by mouth daily.    [provider]  valACYclovir (VALTREX) 1000 MG tablet Take 1,000 mg by mouth 2 (two) times daily. 05/20/15   [provider]    Allergies: Patient has no known allergies.    Review of Systems  All other systems reviewed and are negative.   Updated Vital Signs BP 110/87   Pulse 85   Temp 98.2 F (36.8 C)   Resp 15   SpO2 98%   Physical Exam Vitals and nursing note reviewed.  Constitutional:       General: She is not in acute distress.    Appearance: She is well-developed.  HENT:     Head: Normocephalic and atraumatic.  Eyes:     Conjunctiva/sclera: Conjunctivae normal.  Cardiovascular:     Rate and Rhythm: Normal rate and regular rhythm.     Pulses: Normal pulses.  Pulmonary:     Effort: Pulmonary effort is normal. No respiratory distress.     Breath sounds: Normal breath sounds.  Abdominal:     Palpations: Abdomen is soft.     Tenderness: There is no abdominal tenderness.  Musculoskeletal:        General: No swelling.     Cervical back: Neck supple.  Skin:    General: Skin is warm and dry.     Capillary Refill: Capillary refill takes less than 2 seconds.  Neurological:     Mental Status: She is alert.  Psychiatric:        Mood and Affect: Mood normal.     (all labs ordered are listed, but only abnormal results are displayed) Labs Reviewed  CBC WITH DIFFERENTIAL/PLATELET - Abnormal; Notable for the following components:      Result Value   Hemoglobin 11.9 (*)    HCT 34.9 (*)    All other components within normal limits  D-DIMER, QUANTITATIVE  MAGNESIUM  COMPREHENSIVE METABOLIC PANEL WITH GFR  PRO BRAIN NATRIURETIC PEPTIDE  TSH  T4, FREE  ETHANOL  TROPONIN T, HIGH SENSITIVITY    EKG: EKG Interpretation Date/Time:  Monday December 26 2023 14:51:45 EDT Ventricular Rate:  86 PR Interval:  155 QRS Duration:  101 QT Interval:  382 QTC Calculation: 457 R Axis:   77  Text Interpretation: Sinus rhythm Low voltage, precordial leads Anteroseptal infarct, old Confirmed by Jerrol Agent ([id number]) on 12/26/2023 2:57:48 PM  Radiology: No results found.   Procedures   Medications Ordered in the ED  sodium chloride  0.9 % bolus 1,000 mL (1,000 mLs Intravenous New Bag/Given 12/26/23 1455)                                    Medical Decision Making Amount and/or Complexity of Data Reviewed Labs: ordered.    63 year old female with medical history significant  for hypothyroidism on Synthroid who presents to the emergency department with a chief complaint of heart palpitations.  The patient states that her symptoms came on suddenly around 1 PM today.  She states that yesterday all day she was consuming alcohol.  She is not a regular consumer of alcohol daily.  She states that she was drinking and out in the sun for the weekend and feels dehydrated.  Today she had sudden onset heart palpitations with associated shortness of breath.  She denies any lower extremity swelling.  She denies any chest pain.  No cough.  On arrival the patient tachycardic with heart rates in the 190s, SVT seen on EKG and cardiac telemetry.  While pads were being placed, the patient spontaneously converted to sinus tachycardia.  Given the patient's report of heavy alcohol use yesterday and decreased p.o. intake, and 1 L IV fluid bolus was administered and screening labs were obtained.  Repeat EKG confirms sinus rhythm, rate 86, no acute ischemic changes.    At time of signout to reassess the patient following labs and fluid resuscitation, full laboratory evaluation pending at time of signout.  Signout given to Dr. Ismael at [phone].     Final diagnoses:  SVT (supraventricular tachycardia) [facility])    ED Discharge Orders     None          Jerrol Agent, MD 12/26/23 [phone]

## 2023-12-26 NOTE — ED Provider Notes (Signed)
 F/u lab work. SVT spontaneously resolved Physical Exam  BP 98/75   Pulse 80   Temp 98.2 F (36.8 C)   Resp (!) 22   SpO2 98%   Physical Exam  Procedures  Procedures  ED Course / MDM    Medical Decision Making Amount and/or Complexity of Data Reviewed Labs: ordered.  Risk OTC drugs. Prescription drug management.  Metabolic panel normal except potassium 3.4 calcium 8.1 total protein 5.6 AST 58 D-dimer less than 0.27 CBC white count 6.8 H&H 11.9 and 34.  Patient given oral potassium for low normal potassium level.  First troponin 22, repeat 115.  Consult: Dr. Shlomo cardiology.  I reviewed the patient's history of present illness and troponin elevation with Dr. Shlomo, at this time given patient's age and possible risk of cardiac ischemia she recommends admission with overnight observation.  Aspirin  only no heparin needed.  I have reviewed the patient's troponin change and educated her on the nature of cardiac enzymes and risk for heart attack.  I extensively educated on the physiology of coronary anatomy and potential calcifications with obstruction of undetermined size without other testing modalities.  At this time patient does not wish to be hospitalized.  She is concerned for expensive and things that she needs to do at home.  The patient does not have a strong positive family history, no history of hypertension, no tobacco use patient feels that she is very low risk and wishes to follow-up on outpatient basis.  I have reviewed risks of permanent disability and sudden death.  Patient understands risk and at this time I have made ambulatory referral to cardiology on outpatient basis.  I recommend the patient continue with a daily baby aspirin  and follow-up as soon as possible with low threshold for immediate return should she have any recurrence of symptoms.  Patient voices understanding.          Amber Canning, MD 12/26/23 2022

## 2023-12-26 NOTE — Discharge Instructions (Addendum)
 1.  Take a daily baby aspirin . 2.  Call the cardiology group tomorrow listed in your discharge instructions to schedule your follow-up appointment as soon as possible. 3.  Return to the Emergency Department immediately if you have chest pain, shortness of breath, feel like you will pass out your heart is racing or other concerning changes. 4.  You are leaving AGAINST MEDICAL ADVICE, return at anytime you have concerns or feel that you need additional evaluation.

## 2024-01-11 NOTE — Progress Notes (Incomplete)
  Cardiology Office Note:   Date:  01/11/2024  ID:  Amber Krueger, DOB 1960/09/09, MRN [id number] PCP: Patient, No Pcp Per  [facility] Providers Cardiologist:  None { Chief Complaint: No chief complaint on file.     History of Present Illness:   Amber Krueger is a 62 y.o. female with a PMH of hypothyroidism who presents as a new patient evaluation referred by Dr Armenta.  Patient presented to ED on 12/26/2023 with the chief complaint of heart palpitations.  On arrival to the ED she was noted to be tachycardic to the 190s and an SVT.  As they were putting pads on the patient, she spontaneously converted to NSR.  Labs notable for troponins 22 -> 115, potassium 3.4, AST 58.  She was watched overnight and then ultimately discharged after stability.  She is referred to cardiology for follow-up.   Past Medical History:  Diagnosis Date   Thyroid  disease      Studies Reviewed:    EKG: ***           Risk Assessment/Calculations:   {Does this patient have ATRIAL FIBRILLATION?[phone]} No BP recorded.  {Refresh Note OR Click here to enter BP  :1}***        Physical Exam:     VS:  There were no vitals taken for this visit. ***    Wt Readings from Last 3 Encounters:  05/28/15 134 lb 8 oz (61 kg)  08/09/12 133 lb 9.6 oz (60.6 kg)  01/17/12 129 lb (58.5 kg)     GEN: Well nourished, well developed, in no acute distress NECK: No JVD; No carotid bruits CARDIAC: ***RRR, no murmurs, rubs, gallops RESPIRATORY:  Clear to auscultation without rales, wheezing or rhonchi  ABDOMEN: Soft, non-tender, non-distended, normal bowel sounds EXTREMITIES:  Warm and well perfused, no edema; No deformity, 2+ radial pulses PSYCH: Normal mood and affect   Assessment & Plan PSVT (paroxysmal supraventricular tachycardia) (HCC)  Elevated troponin  -Echo -CCTA ***Repeat CMP,      {Are you ordering a CV Procedure (e.g. stress test, cath, DCCV, TEE, etc)?   Press F2        :[id number]}    This note was written with the assistance of a dictation microphone or AI dictation software. Please excuse any typos or grammatical errors.   Signed, Georganna Dani, MD 01/11/2024 11:44 AM    [facility]

## 2024-01-12 ENCOUNTER — Ambulatory Visit
Attending: Student in an Organized Health Care Education/Training Program | Admitting: Student in an Organized Health Care Education/Training Program

## 2024-01-12 DIAGNOSIS — R7989 Other specified abnormal findings of blood chemistry: Secondary | ICD-10-CM

## 2024-01-12 DIAGNOSIS — I471 Supraventricular tachycardia, unspecified: Secondary | ICD-10-CM

## 2024-05-25 ENCOUNTER — Ambulatory Visit
Admission: RE | Admit: 2024-05-25 | Discharge: 2024-05-25 | Disposition: A | Source: Ambulatory Visit | Attending: Obstetrics and Gynecology | Admitting: Obstetrics and Gynecology

## 2024-05-25 ENCOUNTER — Other Ambulatory Visit: Payer: Self-pay | Admitting: Obstetrics and Gynecology

## 2024-05-25 DIAGNOSIS — R921 Mammographic calcification found on diagnostic imaging of breast: Secondary | ICD-10-CM

## 2024-07-06 ENCOUNTER — Ambulatory Visit: Admitting: Physician Assistant
